# Patient Record
Sex: Male | Born: 2005
Health system: Southern US, Community
[De-identification: ages and names within clinical notes are randomized; demographics above are authoritative.]

## PROBLEM LIST (undated history)

## (undated) DIAGNOSIS — J45909 Unspecified asthma, uncomplicated: Secondary | ICD-10-CM

## (undated) DIAGNOSIS — J302 Other seasonal allergic rhinitis: Secondary | ICD-10-CM

## (undated) HISTORY — DX: Unspecified asthma, uncomplicated: J45.909

## (undated) HISTORY — PX: TYMPANOSTOMY TUBE PLACEMENT: SHX32

## (undated) HISTORY — DX: Other seasonal allergic rhinitis: J30.2

---
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
## 2023-03-13 ENCOUNTER — Encounter (HOSPITAL_BASED_OUTPATIENT_CLINIC_OR_DEPARTMENT_OTHER): Payer: Self-pay | Admitting: Family Medicine

## 2023-03-31 ENCOUNTER — Ambulatory Visit (HOSPITAL_BASED_OUTPATIENT_CLINIC_OR_DEPARTMENT_OTHER): Payer: 59

## 2023-07-30 NOTE — Patient Instructions (Incomplete)
 Allergic rhinitis Continue allergen avoidance measures directed toward dust mite, mold, and pollen as listed below Restart montelukast 10 mg once a day to control allergy symptoms Begin cetirizine 10 mg once a day if needed for runny nose or itch.  You may take an additional dose of cetirizine 10 mg once a day if needed for breakthrough symptoms.Remember to rotate to a different antihistamine about every 3 months. Some examples of over the counter antihistamines include Zyrtec (cetirizine), Xyzal (levocetirizine), Allegra (fexofenadine), and Claritin (loratidine).  Begin Ryaltris 2 sprays in each nostril up to twice a day if needed for nasal symptoms Consider saline nasal rinses as needed for nasal symptoms. Use this before any medicated nasal sprays for best result Consider updating your environmental allergy testing.  Remember to stop antihistamines for 3 days before your testing appointment.  Asthma Begin montelukast 10 mg once a day to control asthma symptoms  Call the clinic if this treatment plan is not working well for you.    Follow up in 6 months or sooner if needed.   Control of Dust Mite Allergen Dust mites play a major role in allergic asthma and rhinitis. They occur in environments with high humidity wherever human skin is found. Dust mites absorb humidity from the atmosphere (ie, they do not drink) and feed on organic matter (including shed human and animal skin). Dust mites are a microscopic type of insect that you cannot see with the naked eye. High levels of dust mites have been detected from mattresses, pillows, carpets, upholstered furniture, bed covers, clothes, soft toys and any woven material. The principal allergen of the dust mite is found in its feces. A gram of dust may contain 1,000 mites and 250,000 fecal particles. Mite antigen is easily measured in the air during house cleaning activities. Dust mites do not bite and do not cause harm to humans, other than by triggering  allergies/asthma.  Ways to decrease your exposure to dust mites in your home:  1. Encase mattresses, box springs and pillows with a mite-impermeable barrier or cover  2. Wash sheets, blankets and drapes weekly in hot water (130 F) with detergent and dry them in a dryer on the hot setting.  3. Have the room cleaned frequently with a vacuum cleaner and a damp dust-mop. For carpeting or rugs, vacuuming with a vacuum cleaner equipped with a high-efficiency particulate air (HEPA) filter. The dust mite allergic individual should not be in a room which is being cleaned and should wait 1 hour after cleaning before going into the room.  4. Do not sleep on upholstered furniture (eg, couches).  5. If possible removing carpeting, upholstered furniture and drapery from the home is ideal. Horizontal blinds should be eliminated in the rooms where the person spends the most time (bedroom, study, television room). Washable vinyl, roller-type shades are optimal.  6. Remove all non-washable stuffed toys from the bedroom. Wash stuffed toys weekly like sheets and blankets above.  7. Reduce indoor humidity to less than 50%. Inexpensive humidity monitors can be purchased at most hardware stores. Do not use a humidifier as can make the problem worse and are not recommended.  Control of Mold Allergen Mold and fungi can grow on a variety of surfaces provided certain temperature and moisture conditions exist.  Outdoor molds grow on plants, decaying vegetation and soil.  The major outdoor mold, Alternaria and Cladosporium, are found in very high numbers during hot and dry conditions.  Generally, a late Summer - Fall peak is  seen for common outdoor fungal spores.  Rain will temporarily lower outdoor mold spore count, but counts rise rapidly when the rainy period ends.  The most important indoor molds are Aspergillus and Penicillium.  Dark, humid and poorly ventilated basements are ideal sites for mold growth.  The next most  common sites of mold growth are the bathroom and the kitchen.  Outdoor [employer] Use air conditioning and keep windows closed Avoid exposure to decaying vegetation. Avoid leaf raking. Avoid grain handling. Consider wearing a face mask if working in moldy areas.  Indoor Mold Control Maintain humidity below 50%. Clean washable surfaces with 5% bleach solution. Remove sources e.g. Contaminated carpets.  Reducing Pollen Exposure The American Academy of Allergy, Asthma and Immunology suggests the following steps to reduce your exposure to pollen during allergy seasons. Do not hang sheets or clothing out to dry; pollen may collect on these items. Do not mow lawns or spend time around freshly cut grass; mowing stirs up pollen. Keep windows closed at night.  Keep car windows closed while driving. Minimize morning activities outdoors, a time when pollen counts are usually at their highest. Stay indoors as much as possible when pollen counts or humidity is high and on windy days when pollen tends to remain in the air longer. Use air conditioning when possible.  Many air conditioners have filters that trap the pollen spores. Use a HEPA room air filter to remove pollen form the indoor air you breathe.

## 2023-07-30 NOTE — Progress Notes (Unsigned)
   [address] [place] [address] Dept: [phone]  FOLLOW UP NOTE  Patient ID: Valentino Nose, male    DOB: 2005/06/24  Age: 18 y.o. MRN: [id number] Date of Office Visit: 07/31/2023  Assessment  Chief Complaint: No chief complaint on file.  HPI Nichollas A Hakim is a 18 year old male who presents to the clinic for follow-up visit.  He was last seen in this clinic on 08/14/2021 by Dr. Dellis Anes for evaluation of allergic rhinitis and history of asthma.  Chart review indicates reference to positive allergy testing to dust mite, mold, and pollens in 2016.  Discussed the use of AI scribe software for clinical note transcription with the patient, who gave verbal consent to proceed.  History of Present Illness      Drug Allergies:  No Known Allergies  Physical Exam: There were no vitals taken for this visit.   Physical Exam  Diagnostics:    Assessment and Plan: No diagnosis found.  No orders of the defined types were placed in this encounter.   There are no Patient Instructions on file for this visit.  No follow-ups on file.    Thank you for the opportunity to care for this patient.  Please do not hesitate to contact me with questions.  Thermon Leyland, FNP [facility]

## 2023-07-31 ENCOUNTER — Ambulatory Visit (INDEPENDENT_AMBULATORY_CARE_PROVIDER_SITE_OTHER): Payer: Self-pay | Admitting: Family Medicine

## 2023-07-31 ENCOUNTER — Other Ambulatory Visit (HOSPITAL_COMMUNITY): Payer: Self-pay

## 2023-07-31 ENCOUNTER — Other Ambulatory Visit: Payer: Self-pay

## 2023-07-31 ENCOUNTER — Encounter: Payer: Self-pay | Admitting: Family Medicine

## 2023-07-31 VITALS — BP 110/80 | HR 84 | Temp 98.7°F | Resp 16 | Ht 69.69 in | Wt 187.8 lb

## 2023-07-31 DIAGNOSIS — J302 Other seasonal allergic rhinitis: Secondary | ICD-10-CM

## 2023-07-31 DIAGNOSIS — J3089 Other allergic rhinitis: Secondary | ICD-10-CM

## 2023-07-31 DIAGNOSIS — J4530 Mild persistent asthma, uncomplicated: Secondary | ICD-10-CM

## 2023-07-31 MED ORDER — CETIRIZINE HCL 10 MG PO TABS
10.0000 mg | ORAL_TABLET | Freq: Every day | ORAL | 5 refills | Status: AC
  Filled 2023-07-31: qty 30, 30d supply, fill #0

## 2023-07-31 MED ORDER — RYALTRIS 665-25 MCG/ACT NA SUSP
2.0000 | Freq: Two times a day (BID) | NASAL | 5 refills | Status: AC | PRN
  Filled 2023-07-31: qty 29, 30d supply, fill #0

## 2023-07-31 MED ORDER — MONTELUKAST SODIUM 10 MG PO TABS
10.0000 mg | ORAL_TABLET | Freq: Every day | ORAL | 5 refills | Status: AC
  Filled 2023-07-31: qty 30, 30d supply, fill #0

## 2023-07-31 NOTE — Progress Notes (Signed)
..  Medication Samples have been provided to the patient.  Drug name: Ryaltris       Strength: 665/14mcg        Qty: 1  LOT: [id number]  Exp.Date: 07/20/24  Dosing instructions: 2 sprays in each nostril up to twice a day if needed for nasal symptoms  The patient has been instructed regarding the correct time, dose, and frequency of taking this medication, including desired effects and most common side effects.   Cody Rogers 2:05 PM 07/31/2023

## 2023-08-01 ENCOUNTER — Other Ambulatory Visit (HOSPITAL_COMMUNITY): Payer: Self-pay

## 2023-08-04 ENCOUNTER — Other Ambulatory Visit (HOSPITAL_COMMUNITY): Payer: Self-pay

## 2023-08-12 ENCOUNTER — Other Ambulatory Visit (HOSPITAL_COMMUNITY): Payer: Self-pay

## 2023-09-17 ENCOUNTER — Encounter: Payer: Self-pay | Admitting: Family Medicine

## 2023-09-29 ENCOUNTER — Encounter (HOSPITAL_BASED_OUTPATIENT_CLINIC_OR_DEPARTMENT_OTHER): Payer: Self-pay | Admitting: Family Medicine

## 2023-09-29 ENCOUNTER — Ambulatory Visit (INDEPENDENT_AMBULATORY_CARE_PROVIDER_SITE_OTHER): Payer: 59 | Admitting: Family Medicine

## 2023-09-29 VITALS — BP 125/67 | HR 79 | Ht 70.5 in | Wt 184.6 lb

## 2023-09-29 DIAGNOSIS — Z23 Encounter for immunization: Secondary | ICD-10-CM

## 2023-09-29 DIAGNOSIS — Z13220 Encounter for screening for lipoid disorders: Secondary | ICD-10-CM

## 2023-09-29 DIAGNOSIS — Z0000 Encounter for general adult medical examination without abnormal findings: Secondary | ICD-10-CM | POA: Insufficient documentation

## 2023-09-29 NOTE — Progress Notes (Signed)
 Subjective:    CC: Annual Physical Exam  HPI: Cody Rogers is a 18 y.o. presenting for annual physical  I reviewed the past medical history, family history, social history, surgical history, and allergies today and no changes were needed.  Please see the problem list section below in epic for further details.  Past Medical History: Past Medical History:  Diagnosis Date   Asthma    Seasonal allergies    Past Surgical History: Past Surgical History:  Procedure Laterality Date   TYMPANOSTOMY TUBE PLACEMENT     Social History: Social History   Socioeconomic History   Marital status: Single    Spouse name: Not on file   Number of children: Not on file   Years of education: Not on file   Highest education level: Not on file  Occupational History   Not on file  Tobacco Use   Smoking status: Never    Passive exposure: Never   Smokeless tobacco: Never  Vaping Use   Vaping status: Never Used  Substance and Sexual Activity   Alcohol use: Never   Drug use: Never   Sexual activity: Never  Other Topics Concern   Not on file  Social History Narrative   Not on file   Social Drivers of Health   Financial Resource Strain: Low Risk  (09/29/2023)   Overall Financial Resource Strain (CARDIA)    Difficulty of Paying Living Expenses: Not hard at all  Food Insecurity: No Food Insecurity (09/29/2023)   Hunger Vital Sign    Worried About Running Out of Food in the Last Year: Never true    Ran Out of Food in the Last Year: Never true  Transportation Needs: No Transportation Needs (09/29/2023)   PRAPARE - Administrator, Civil Service (Medical): No    Lack of Transportation (Non-Medical): No  Physical Activity: Inactive (09/29/2023)   Exercise Vital Sign    Days of Exercise per Week: 0 days    Minutes of Exercise per Session: 0 min  Stress: No Stress Concern Present (09/29/2023)   [employer] of Occupational Health - Occupational Stress Questionnaire    Feeling of  Stress : Not at all  Social Connections: Socially Isolated (09/29/2023)   Social Connection and Isolation Panel [NHANES]    Frequency of Communication with Friends and Family: More than three times a week    Frequency of Social Gatherings with Friends and Family: More than three times a week    Attends Religious Services: Never    Database administrator or Organizations: No    Attends Engineer, structural: Never    Marital Status: Never married   Family History: Family History  Problem Relation Age of Onset   Diabetes Mother    Allergic rhinitis Father    Asthma Father    Angioedema Neg Hx    Eczema Neg Hx    Immunodeficiency Neg Hx    Urticaria Neg Hx    Allergies: No Known Allergies Medications: See med rec.  Review of Systems: No headache, visual changes, nausea, vomiting, diarrhea, constipation, dizziness, abdominal pain, skin rash, fevers, chills, night sweats, swollen lymph nodes, weight loss, chest pain, body aches, joint swelling, muscle aches, shortness of breath, mood changes, visual or auditory hallucinations.  Objective:    BP 125/67 (BP Location: Left Arm, Patient Position: Sitting, Cuff Size: Normal)   Pulse 79   Ht 5' 10.5" (1.791 m)   Wt 184 lb 9.6 oz (83.7 kg)   SpO2  100%   BMI 26.11 kg/m   General: Well Developed, well nourished, and in no acute distress. Neuro: Alert and oriented x3, extra-ocular muscles intact, sensation grossly intact. Cranial nerves II through XII are intact, motor, sensory, and coordinative functions are all intact. HEENT: Normocephalic, atraumatic, pupils equal round reactive to light, neck supple, no masses, no lymphadenopathy, thyroid nonpalpable. Oropharynx, nasopharynx, external ear canals are unremarkable. Skin: Warm and dry, no rashes noted. Cardiac: Regular rate and rhythm, no murmurs rubs or gallops. Respiratory: Clear to auscultation bilaterally. Not using accessory muscles, speaking in full sentences. Abdominal:  Soft, nontender, nondistended, positive bowel sounds, no masses, no organomegaly. Musculoskeletal: Shoulder, elbow, wrist, hip, knee, ankle stable, and with full range of motion.  Impression and Recommendations:    Wellness examination Assessment & Plan: Routine HCM labs ordered. HCM reviewed/discussed. Anticipatory guidance regarding healthy weight, lifestyle and choices given. Recommend healthy diet.  Recommend approximately 150 minutes/week of moderate intensity exercise Recommend regular dental and vision exams Always use seatbelt/lap and shoulder restraints Recommend using smoke alarms and checking batteries at least twice a year Recommend using sunscreen when outside Discussed immunization recommendations  Orders: -     CBC with Differential/Platelet -     Comprehensive metabolic panel with GFR -     Lipid panel  Screening for lipid disorders -     Lipid panel  Need for HPV vaccination -     HPV 9-valent vaccine,Recombinat  Return in about 1 year (around 09/28/2024) for CPE. 4 months for nurse visit for HPV #3   ___________________________________________ Manly Nestle de [place], MD, ABFM, [facility] Primary Care and Sports Medicine [facility]

## 2023-09-29 NOTE — Patient Instructions (Signed)
   Medication Instructions:  Your physician recommends that you continue on your current medications as directed. Please refer to the Current Medication list given to you today. --If you need a refill on any your medications before your next appointment, please call your pharmacy first. If no refills are authorized on file call the office.-- Lab Work: Your physician has recommended that you have lab work today: today If you have labs (blood work) drawn today and your tests are completely normal, you will receive your results via MyChart message OR a phone call from our staff.  Please ensure you check your voicemail in the event that you authorized detailed messages to be left on a delegated number. If you have any lab test that is abnormal or we need to change your treatment, we will call you to review the results.    Follow-Up: Your next appointment:   Your physician recommends that you schedule a follow-up appointment in: 1 year physical with Dr. de [place]  You will receive a text message or e-mail with a link to a survey about your care and experience with [place] today! We would greatly appreciate your feedback!   Thanks for letting us be apart of your health journey!!  Primary Care and Sports Medicine   Dr. Ceasar Mons [place]   We encourage you to activate your patient portal called "MyChart".  Sign up information is provided on this After Visit Summary.  MyChart is used to connect with patients for Virtual Visits (Telemedicine).  Patients are able to view lab/test results, encounter notes, upcoming appointments, etc.  Non-urgent messages can be sent to your provider as well. To learn more about what you can do with MyChart, please visit --  [email or web address].

## 2023-09-29 NOTE — Assessment & Plan Note (Signed)

## 2023-09-30 ENCOUNTER — Ambulatory Visit (HOSPITAL_BASED_OUTPATIENT_CLINIC_OR_DEPARTMENT_OTHER): Payer: Self-pay | Admitting: Family Medicine

## 2023-09-30 LAB — CBC WITH DIFFERENTIAL/PLATELET
Basophils Absolute: 0.1 10*3/uL (ref 0.0–0.2)
Basos: 1 %
EOS (ABSOLUTE): 0.2 10*3/uL (ref 0.0–0.4)
Eos: 5 %
Hematocrit: 44.4 % (ref 37.5–51.0)
Hemoglobin: 14.8 g/dL (ref 13.0–17.7)
Immature Grans (Abs): 0 10*3/uL (ref 0.0–0.1)
Immature Granulocytes: 0 %
Lymphocytes Absolute: 2.1 10*3/uL (ref 0.7–3.1)
Lymphs: 44 %
MCH: 31.2 pg (ref 26.6–33.0)
MCHC: 33.3 g/dL (ref 31.5–35.7)
MCV: 94 fL (ref 79–97)
Monocytes Absolute: 0.4 10*3/uL (ref 0.1–0.9)
Monocytes: 8 %
Neutrophils Absolute: 2.1 10*3/uL (ref 1.4–7.0)
Neutrophils: 42 %
Platelets: 192 10*3/uL (ref 150–450)
RBC: 4.74 x10E6/uL (ref 4.14–5.80)
RDW: 12.6 % (ref 11.6–15.4)
WBC: 4.9 10*3/uL (ref 3.4–10.8)

## 2023-09-30 LAB — COMPREHENSIVE METABOLIC PANEL WITH GFR
ALT: 9 IU/L (ref 0–44)
AST: 17 IU/L (ref 0–40)
Albumin: 4.6 g/dL (ref 4.3–5.2)
Alkaline Phosphatase: 108 IU/L (ref 51–125)
BUN/Creatinine Ratio: 9 (ref 9–20)
BUN: 9 mg/dL (ref 6–20)
Bilirubin Total: 0.5 mg/dL (ref 0.0–1.2)
CO2: 21 mmol/L (ref 20–29)
Calcium: 9.5 mg/dL (ref 8.7–10.2)
Chloride: 105 mmol/L (ref 96–106)
Creatinine, Ser: 0.96 mg/dL (ref 0.76–1.27)
Globulin, Total: 2 g/dL (ref 1.5–4.5)
Glucose: 97 mg/dL (ref 70–99)
Potassium: 4.5 mmol/L (ref 3.5–5.2)
Sodium: 141 mmol/L (ref 134–144)
Total Protein: 6.6 g/dL (ref 6.0–8.5)
eGFR: 117 mL/min/{1.73_m2} (ref >59)

## 2023-09-30 LAB — LIPID PANEL
Chol/HDL Ratio: 2.7 ratio (ref 0.0–5.0)
Cholesterol, Total: 105 mg/dL (ref 100–169)
HDL: 39 mg/dL — ABNORMAL LOW (ref >39)
LDL Chol Calc (NIH): 57 mg/dL (ref 0–109)
Triglycerides: 31 mg/dL (ref 0–89)
VLDL Cholesterol Cal: 9 mg/dL (ref 5–40)

## 2024-01-29 ENCOUNTER — Ambulatory Visit (INDEPENDENT_AMBULATORY_CARE_PROVIDER_SITE_OTHER): Admitting: *Deleted

## 2024-01-29 DIAGNOSIS — Z23 Encounter for immunization: Secondary | ICD-10-CM | POA: Diagnosis not present

## 2024-01-29 NOTE — Progress Notes (Signed)
 Patient is in office today for a nurse visit for Immunization. Patient Injection was given in the  Left deltoid. Patient tolerated injection well. Patient was given 3rd hpv vaccination.

## 2024-04-17 ENCOUNTER — Other Ambulatory Visit: Payer: Self-pay

## 2024-04-17 ENCOUNTER — Encounter (HOSPITAL_BASED_OUTPATIENT_CLINIC_OR_DEPARTMENT_OTHER): Payer: Self-pay

## 2024-04-17 ENCOUNTER — Emergency Department (HOSPITAL_BASED_OUTPATIENT_CLINIC_OR_DEPARTMENT_OTHER)
Admission: EM | Admit: 2024-04-17 | Discharge: 2024-04-17 | Disposition: A | Discharge status: Home / Self Care | Attending: Emergency Medicine | Admitting: Emergency Medicine

## 2024-04-17 DIAGNOSIS — J101 Influenza due to other identified influenza virus with other respiratory manifestations: Secondary | ICD-10-CM | POA: Insufficient documentation

## 2024-04-17 DIAGNOSIS — R059 Cough, unspecified: Secondary | ICD-10-CM | POA: Diagnosis present

## 2024-04-17 LAB — RESP PANEL BY RT-PCR (RSV, FLU A&B, COVID)  RVPGX2
Influenza A by PCR: POSITIVE — AB
Influenza B by PCR: NEGATIVE
Resp Syncytial Virus by PCR: NEGATIVE
SARS Coronavirus 2 by RT PCR: NEGATIVE

## 2024-04-17 MED ORDER — ONDANSETRON 4 MG PO TBDP: 4.0000 mg | ORAL_TABLET | Freq: Three times a day (TID) | ORAL | 0 refills | Status: AC | PRN

## 2024-04-17 MED ORDER — KETOROLAC TROMETHAMINE 60 MG/2ML IM SOLN
30.0000 mg | Freq: Once | INTRAMUSCULAR | Status: AC
  Administered 2024-04-17: 30 mg via INTRAMUSCULAR
  Filled 2024-04-17: qty 2

## 2024-04-17 MED ORDER — OSELTAMIVIR PHOSPHATE 75 MG PO CAPS: 75.0000 mg | ORAL_CAPSULE | Freq: Two times a day (BID) | ORAL | 0 refills | Status: AC

## 2024-04-17 MED ORDER — ONDANSETRON 4 MG PO TBDP
4.0000 mg | ORAL_TABLET | Freq: Once | ORAL | Status: AC
  Administered 2024-04-17: 4 mg via ORAL
  Filled 2024-04-17: qty 1

## 2024-04-17 NOTE — ED Provider Notes (Signed)
 " [place] EMERGENCY DEPARTMENT AT [facility] Provider Note   CSN: [id number] Arrival date & time: 04/17/24  0143     Patient presents with: Cough   Cody Rogers is a 18 y.o. male.    Cough    18 year old male presenting to the emergency department with 1 day of headache, muscle aches and nausea and vomiting.  He denies any abdominal pain.  He has had a cough as well.  No known sick contacts.  He is tolerating p.o. intake.  Prior to Admission medications  Medication Sig Start Date End Date Taking? Authorizing Provider  ondansetron  (ZOFRAN -ODT) 4 MG disintegrating tablet Take 1 tablet (4 mg total) by mouth every 8 (eight) hours as needed. 04/17/24  Yes Jerrol Agent, MD  oseltamivir  (TAMIFLU ) 75 MG capsule Take 1 capsule (75 mg total) by mouth every 12 (twelve) hours. 04/17/24  Yes Jerrol Agent, MD  cetirizine  (ZYRTEC ) 10 MG tablet Take 1 tablet (10 mg total) by mouth daily. 07/31/23   Cari Arlean HERO, FNP  fluticasone  (FLONASE ) 50 MCG/ACT nasal spray Place into both nostrils daily.    [provider]  montelukast  (SINGULAIR ) 10 MG tablet Take 1 tablet (10 mg total) by mouth at bedtime. Patient not taking: Reported on 09/29/2023 07/31/23   Cari Arlean HERO, FNP  Olopatadine -Mometasone  (RYALTRIS ) [phone] MCG/ACT SUSP Place 2 sprays into the nose 2 (two) times daily as needed. 07/31/23   Cari Arlean HERO, FNP    Allergies: Patient has no known allergies.    Review of Systems  Respiratory:  Positive for cough.   All other systems reviewed and are negative.   Updated Vital Signs BP 124/83 (BP Location: Right Arm)   Pulse (!) 106   Temp 99.8 F (37.7 C)   Resp 18   Ht 5' 10 (1.778 m)   Wt 81.6 kg   SpO2 99%   BMI 25.83 kg/m   Physical Exam Vitals and nursing note reviewed.  Constitutional:      General: He is not in acute distress.    Appearance: He is well-developed.  HENT:     Head: Normocephalic and atraumatic.  Eyes:     Conjunctiva/sclera:  Conjunctivae normal.  Cardiovascular:     Rate and Rhythm: Normal rate and regular rhythm.  Pulmonary:     Effort: Pulmonary effort is normal. No respiratory distress.     Breath sounds: Normal breath sounds.  Abdominal:     Palpations: Abdomen is soft.  Musculoskeletal:        General: No swelling.     Cervical back: Neck supple.  Skin:    General: Skin is warm and dry.     Capillary Refill: Capillary refill takes less than 2 seconds.  Neurological:     Mental Status: He is alert.  Psychiatric:        Mood and Affect: Mood normal.     (all labs ordered are listed, but only abnormal results are displayed) Labs Reviewed  RESP PANEL BY RT-PCR (RSV, FLU A&B, COVID)  RVPGX2 - Abnormal; Notable for the following components:      Result Value   Influenza A by PCR POSITIVE (*)    All other components within normal limits    EKG: None  Radiology: No results found.   Procedures   Medications Ordered in the ED  ketorolac  (TORADOL ) injection 30 mg (has no administration in time range)  ondansetron  (ZOFRAN -ODT) disintegrating tablet 4 mg (has no administration in time range)  Medical Decision Making    18 year old male presenting to the emergency department with 1 day of headache, muscle aches and nausea and vomiting.  He denies any abdominal pain.  He has had a cough as well.  No known sick contacts.  He is tolerating p.o. intake.  On arrival, the patient was vitally stable, afebrile, mildly tachycardic heart rate 106, hemodynamically stable.  On exam the patient had an unremarkable physical exam.  Patient presenting with symptoms of a viral illness, suspect influenza.  PCR testing was collected for COVID and flu and RSV and the patient tested positive for flu A.  The patient was offered Tamiflu  after discussion of the risk and benefits and elected to be prescribed the medication.  He was advised Tylenol and Motrin for pain control and fever,  Zofran  was prescribed for nausea, return precautions were provided.  Patient overall stable for discharge at this time.     Final diagnoses:  Influenza A    ED Discharge Orders          Ordered    oseltamivir  (TAMIFLU ) 75 MG capsule  Every 12 hours        04/17/24 0401    ondansetron  (ZOFRAN -ODT) 4 MG disintegrating tablet  Every 8 hours PRN        04/17/24 0401               Jerrol Agent, MD 04/17/24 0403  "

## 2024-04-17 NOTE — Discharge Instructions (Addendum)
 You tested positive for influenza.  You have been prescribed Tamiflu .  Zofran  has been prescribed for nausea.  Take Tylenol and Motrin for pain control and muscle aches and fever control.  Ensure you are continuing to push oral fluid resuscitation over the next few days.  Return for any severe worsening symptoms or inability tolerate p.o.

## 2024-04-17 NOTE — ED Triage Notes (Signed)
 Pt to ED from home with c/o a cough, headache and nausea which began today. Pt reports no sick exposures. Arrives A+O, VSS, NADN.

## 2024-04-23 ENCOUNTER — Encounter (HOSPITAL_BASED_OUTPATIENT_CLINIC_OR_DEPARTMENT_OTHER): Payer: Self-pay | Admitting: Family Medicine

## 2024-09-29 ENCOUNTER — Encounter (HOSPITAL_BASED_OUTPATIENT_CLINIC_OR_DEPARTMENT_OTHER): Admitting: Family Medicine
